# Patient Record
Sex: Male | Born: 2005 | Hispanic: Yes | Marital: Single | Smoking: Never smoker
Health system: Southern US, Community
[De-identification: ages and names within clinical notes are randomized; demographics above are authoritative.]

---
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
## 2024-01-06 ENCOUNTER — Telehealth: Payer: Self-pay | Admitting: Pediatrics

## 2024-01-06 NOTE — Telephone Encounter (Signed)
 Left Voicemail to inform about 2 hour delay will have to reschedule due to inclement weather

## 2024-01-07 ENCOUNTER — Ambulatory Visit: Admitting: Pediatrics

## 2024-01-20 ENCOUNTER — Encounter: Admitting: Family

## 2024-01-20 ENCOUNTER — Telehealth: Payer: Self-pay | Admitting: Family

## 2024-01-20 NOTE — Telephone Encounter (Signed)
 01/20/24 appointment with Bari Molt has been cancelled due to the provider being out of the office. Called parent/guardian to inform them and requested a call back to reschedule.

## 2024-02-13 ENCOUNTER — Encounter: Payer: Self-pay | Admitting: Pediatrics

## 2024-02-13 ENCOUNTER — Ambulatory Visit (INDEPENDENT_AMBULATORY_CARE_PROVIDER_SITE_OTHER): Admitting: Family

## 2024-02-13 ENCOUNTER — Other Ambulatory Visit (HOSPITAL_COMMUNITY)
Admission: RE | Admit: 2024-02-13 | Discharge: 2024-02-13 | Disposition: A | Discharge status: Home / Self Care | Source: Ambulatory Visit | Attending: Family | Admitting: Family

## 2024-02-13 ENCOUNTER — Encounter: Payer: Self-pay | Admitting: Family

## 2024-02-13 VITALS — BP 126/85 | HR 75 | Ht 66.0 in | Wt 186.4 lb

## 2024-02-13 DIAGNOSIS — Z113 Encounter for screening for infections with a predominantly sexual mode of transmission: Secondary | ICD-10-CM | POA: Insufficient documentation

## 2024-02-13 DIAGNOSIS — Z1339 Encounter for screening examination for other mental health and behavioral disorders: Secondary | ICD-10-CM

## 2024-02-13 DIAGNOSIS — M25562 Pain in left knee: Secondary | ICD-10-CM

## 2024-02-13 DIAGNOSIS — Z0001 Encounter for general adult medical examination with abnormal findings: Secondary | ICD-10-CM | POA: Diagnosis not present

## 2024-02-13 DIAGNOSIS — Z6830 Body mass index (BMI) 30.0-30.9, adult: Secondary | ICD-10-CM | POA: Diagnosis not present

## 2024-02-13 MED ORDER — NAPROXEN 500 MG PO TABS: ORAL_TABLET | ORAL | 0 refills | Status: AC

## 2024-02-13 NOTE — Patient Instructions (Signed)
  Dental list - Updated 11/28/2023  These dentists accept Medicaid.  The list is a courtesy and for your convenience. Estos dentistas aceptan Medicaid.  La lista es para su conveniencia y es una cortesa.    [facility] [phone] [address] [place] [address] Se habla espaol Ages 64 to 20 years old Accepts ALL Medicaid plans [facility]  [phone] Clancy Hammersmith, DDS (Spanish speaking) [address] [place]  [address] Se habla espaol New patients must be 6 or under. Can remain established until age 71 Parent may go with child if needed Accepts ALL Medicaid plans  Nikki armin Nikki DMD  [phone] [address] [place] [address] Se habla espaol Vietnamese spoken Ages 1 up through adulthood Parent may go with child Accepts ALL Medicaid plans other than family planning Medicaid [facility]  [phone] [address]. [place] [address] Se habla espaol Ages 1-20 Ages 1-3y parents may go back 4+ go back by themselves parents can watch at "[place] area" Accepts ALL Medicaid plans  [facility] DDS  [phone]  [address]  [place] [address] Vietnamese spoken New patients must be ages 34 or under. Can remain established until age 58 Approx 3 month wait time  Parent may go with child Accepts ALL Medicaid plans [facility]     [phone] [address] [place]. [place] [address] Requires certification. Call for information. Requiere certificacin. Llame para informacin. Algunos dias se habla espaol  From birth to 20 years Parent possibly goes with child, Also accept pregnant mom with referral. Accepts ALL Medicaid plans  Abran Kenner DDS  [phone] [address] [place] [address] Se habla espaol  Ages 47 months to 58 years old Parent may go with child Accepts ALL Medicaid plans [facility] DDS     Camellia DOROTHA Cagey DDS  [phone] [address] [place] [address] Se habla espaol- phone interpreters Age 10yo and up through adulthood Approx 3 month wait time Parent may go with child, 15+ go back alone Accepts ALL Medicaid plans  [facility] [phone] Se habla espaol [address] [place], [place] [address]  Ages 35 and under only  Accepts ALL Medicaid plans [facility] [address] [place], [place] [address] Phone: [phone] Fax: [phone] Accepts [facility]  [facility]  [phone] [address] [place], [place], [place] [address] Se habla espaol Parent may NOT go with child Accepts ALL Medicaid plans & those w/o insurance  [facility] [phone] [address] [place], [place] [address]  Se habla espaol Ages 73 and under only Parents may go back with child  Accepts ALL Medicaid plans  [facility] [phone] Dr. Sona Isharani [address] [place], [place] [address] Se habla espaol Ages 63 and under Special needs children welcome Accepts ALL Medicaid plans [facility]   [phone] Se Habla espanol [address] [place], [place] [address] [address] next to [employer] Trends 0-22yrs  Accepts All Medicaid & Private Ins  plans

## 2024-02-13 NOTE — Progress Notes (Signed)
 Routine Well-Adolescent Visit   History was provided by the patient.  Roberto Kaufman is a 19 y.o. male who is here for [facility]. PCP Confirmed?  yes  Leta Crazier, MD  Growth Metrics: BMI today:  Body mass index is 30.09 kg/m.   Confidentiality was discussed with the patient and if applicable, with caregiver as well.  Patient's personal or confidential phone number:  On file; sent text to set up My Chart today during visit.   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  Nutrition:  Eating Behaviors: appetite not as good but tries to eat twice daily just with new work schedule  Adequate calcium in diet: yes  Supplements/Vitamins: none Water intake: 1-2 bottles daily   Exercise/Media:  Sports/Activities: just work  Screen Time: counseling provided  Concerns with social media/online risks: no  Sleep:  Average hours per night: varies sometimes 1-2 hour naps, 8 hours  Wakes rested: yes Snoring: yes  Dental Care:  Up to date on cleanings: no  Any concerns: has intermittent pain with chewing R lower Braces: no Wisdom teeth: had them removed  Vision/Corrective Lenses:  Hearing Screening   500Hz  1000Hz  2000Hz  4000Hz   Right ear 20 20 20 20   Left ear 20 20 20 20    Vision Screening   Right eye Left eye Both eyes  Without correction 20/20 20/20 20/20   With correction       Confidential/Social  History: Lives with: mom, sister (14)  Parental relations: good Siblings: good Friends/Peers: yes Tobacco? no Nicotine/Vaping: no Secondhand smoke exposure?no Drugs/ETOH? Occasional ETOH, marijuana use with friends, relaxing; does not feel worried about use; never while driving   Sexual History:  Sexually active?yes Pain with intercourse: no Concerns for STIs: no Condoms provided.   Safety: Safe at home, in school & in relationships? Yes SI/HI? no   The patient completed the Rapid Assessment of Adolescent Preventive Services (RAAPS) questionnaire, and identified the following as issues: ETOH and marijuana use, condom use; nutrition and sleep; therapy if needed for worries; helmet safety  Issues were addressed and counseling provided.   Additional topics were addressed as anticipatory guidance.  Social Determinants of Health:  NO second hand smoke/vaping exposure.  SOMETIMES worry about food insecurity within last year.  SOMETIMES actual food insecurity within last 12 months.   Review of Systems  Constitutional:  Negative for fever and weight loss.  HENT:  Positive for congestion (nasal congestion x month) and sore throat.   Eyes:  Negative for pain and discharge.  Respiratory:  Negative for cough and shortness of breath.   Cardiovascular:  Negative for chest pain and palpitations.  Gastrointestinal:  Negative for abdominal pain, constipation, diarrhea, nausea and vomiting.  Genitourinary:  Negative for dysuria.       Denies asymmetry concerns, urine stream issues, pain, lesions, masses, rashes or irritations   Musculoskeletal:  Positive for joint pain (L knee) and myalgias (L knee).  Neurological:  Negative for dizziness, seizures and headaches.  Psychiatric/Behavioral:  Negative for depression and suicidal ideas. The patient is not nervous/anxious.     The following portions of the patient's history were reviewed and updated as appropriate: allergies, current  medications, past family history, past medical history, past social history, past surgical history, and problem list.  No Known Allergies  Family History:  No family history on file.  Physical Exam:  Vitals:   02/13/24 1003 02/13/24 1004  BP: 134/78 126/85  Pulse: 72 75  Weight:  186 lb 6.4 oz (84.6 kg)  Height:  5' 6 (1.676 m)   BP 126/85   Pulse 75   Ht 5' 6 (1.676 m)   Wt 186 lb 6.4 oz (84.6 kg)   BMI 30.09 kg/m  Body mass index: body mass index is 30.09 kg/m.  Blood pressure %iles are not available for patients who are 18 years or older.  Physical Exam Constitutional:      General: He is not in acute distress.    Appearance: He is well-developed.  HENT:     Mouth/Throat:     Mouth: Mucous membranes are moist.  Eyes:     Extraocular Movements: Extraocular movements intact.     Pupils: Pupils are equal, round, and reactive to light.  Neck:     Thyroid: No thyromegaly.  Cardiovascular:     Rate and Rhythm: Normal rate and regular rhythm.     Heart sounds: No murmur heard. Pulmonary:     Breath sounds: Normal breath sounds.  Abdominal:     Palpations: Abdomen is soft. There is no mass.     Tenderness: There is no abdominal tenderness. There is no guarding.  Genitourinary:    Comments: deferred Musculoskeletal:     Right knee: Normal.     Left knee: No swelling, erythema or bony tenderness. Decreased range of motion.     Comments: Slightly favors L knee when ambulating   Lymphadenopathy:     Cervical: No cervical adenopathy.  Skin:    General: Skin is warm.     Capillary Refill: Capillary refill takes less than 2 seconds.     Findings: No rash.  Neurological:     General: No focal deficit present.     Mental Status: He is alert and oriented to person, place, and time.     Comments: No tremor  Psychiatric:        Mood and Affect: Mood normal.     Assessment/Plan: 1. Encounter for annual general medical examination with abnormal findings in adult  (Primary) 2. BMI 30.0-30.9,adult 3. Acute pain of left knee -referral to ortho for worsening L knee pain 2/2 skateboarding incident; discussed NSAID use for pain during work shift; referral to ortho for additional work-up - naproxen  (NAPROSYN ) 500 MG tablet; Take one tablet by mouth before work each day and may repeat in 12 hours if needed for pain. Take with food.  Dispense: 30 tablet; Refill: 0 - Ambulatory referral to Orthopedics  4. Routine screening for STI (sexually transmitted infection) - Urine cytology ancillary only   -food bag today; continue to assess food insecurity  -offered therapy for concerns re: mom's medical conditions; continue to monitor; return as needed   Follow-up:  as needed, yearly for annual physical.

## 2024-02-14 LAB — URINE CYTOLOGY ANCILLARY ONLY
Chlamydia: NEGATIVE
Comment: NEGATIVE
Comment: NEGATIVE
Comment: NORMAL
Neisseria Gonorrhea: NEGATIVE
Trichomonas: NEGATIVE
